# Patient Record
Sex: Female | Born: 1950 | Race: White | Hispanic: Yes | Marital: Married | Smoking: Former smoker
Health system: Southern US, Community
[De-identification: ages and names within clinical notes are randomized; demographics above are authoritative.]

## PROBLEM LIST (undated history)

## (undated) DIAGNOSIS — I70269 Atherosclerosis of native arteries of extremities with gangrene, unspecified extremity: Secondary | ICD-10-CM

## (undated) HISTORY — DX: Atherosclerosis of native arteries of extremities with gangrene, unspecified extremity: I70.269

---

## 2010-09-23 ENCOUNTER — Encounter (INDEPENDENT_AMBULATORY_CARE_PROVIDER_SITE_OTHER): Payer: Self-pay | Admitting: Vascular Surgery

## 2010-09-23 ENCOUNTER — Encounter (INDEPENDENT_AMBULATORY_CARE_PROVIDER_SITE_OTHER): Payer: Self-pay

## 2010-09-23 DIAGNOSIS — I739 Peripheral vascular disease, unspecified: Secondary | ICD-10-CM

## 2010-09-23 DIAGNOSIS — L98499 Non-pressure chronic ulcer of skin of other sites with unspecified severity: Secondary | ICD-10-CM

## 2010-09-23 NOTE — Assessment & Plan Note (Signed)
OFFICE VISIT  Taylor Valentine, Taylor Valentine DOB:  1950/06/03                                       09/23/2010 EAVWU#:[id number]  CHIEF COMPLAINT:  Gangrene, right third toe.  HISTORY OF PRESENT ILLNESS:  The patient is a 60 year old female referred by Dr. Suzette Battiest for evaluation of right third toe gangrene. This has been present for approximately 3 weeks.  The patient apparently ran over her toe with some scaffolding approximately with months ago, and it has slowly deteriorated.  The patient really has no significant past medical history.  However, she does not have a primary care doctor and usually only goes to the doctor if she has some sort of problem. She does not know if she has a history of diabetes.  She is a smoker and currently smokes 1/2 pack of cigarettes per day.  She was placed on Keflex Monday with no real change in the appearance of her foot.  She denies any fever or chills.  She does not know if she has hypertension or elevated cholesterol.  She was hypertensive on our office visit today.  She denies any family history of early vascular disease at age less than 61 and states that no other people in her family have had this sort of problem.  PAST MEDICAL HISTORY:  Otherwise unremarkable.  PAST SURGICAL HISTORY:  None.  SOCIAL HISTORY:  She is married.  She has 2 children.  She works in her own business as a Warehouse manager.  Smoking history is as listed above.  She does not consume alcohol regularly.  FAMILY HISTORY:  As listed above.  REVIEW OF SYSTEMS:  A full 12-point review of systems was performed with the patient today.  All of the systems were negative.  Please see intake referral form for details.  ALLERGIES:  She has no known drug allergies.  MEDICATIONS:  Keflex 500 mg 3 times a day.  PHYSICAL EXAMINATION:  Vital Signs:  Blood pressure 176/93 in the left arm, heart rate 80 and regular, respirations 18.  HEENT:   Unremarkable. Neck:  2+ carotid pulses without bruit.  Chest:  Clear to auscultation. Cardiac:  Exam is regular rate and rhythm without murmur.  Abdomen: Soft, nontender, nondistended, no masses.  Extremities:  She has 2+ radial pulses bilaterally.  She has a 2+ left femoral pulse.  She has an absent right femoral pulse.  She has absent popliteal and pedal pulses bilaterally.  Musculoskeletal:  Exam shows no obvious major joint deformities.  Neurologic:  Exam shows symmetric upper extremity and lower extremity motor strength which is 5/5.  Skin:  She has an open toe wound with early gangrenous changes of the right third toe.  There is exposed bone at the distal phalanx.  There is no real significant surrounding erythema.  There is no purulent drainage.  There is no obvious abscess.  STUDIES:  She had bilateral ABIs performed today which were 0.56 on the right, 0.77 on the left.  Waveforms were monophasic bilaterally.  SUMMARY:  The patient has evidence of gangrene in her right third toe with evidence of arterial compromise with decreased ABIs.  The best option at this point would be to schedule her for an arteriogram, bilateral lower extremity runoff, possible angioplasty and stenting or potentially she may get a bypass reconstruction to improve perfusion of her right foot.  I  discussed with her today that she will definitely need amputation of her third toe, but we would wait to see what the arteriogram showed to determine what her revascularization options would be and then schedule her for the toe amputation subsequent to this. Risks, benefits, possible complications and procedure details including but not limited to bleeding, infection, vessel injury, contrast reaction were explained to the patient today.  She understands and agrees to proceed.  Arteriogram is scheduled for tomorrow, September 24, 2010.    Janetta Hora. Fields, MD Electronically Signed  CEF/MEDQ  D:  09/23/2010  T:   09/23/2010  Job:  [id number]  cc:   Sherlynn Stalls Dr. Cy Blamer

## 2010-09-24 ENCOUNTER — Other Ambulatory Visit: Payer: Self-pay | Admitting: Vascular Surgery

## 2010-09-24 ENCOUNTER — Encounter (HOSPITAL_COMMUNITY)
Admit: 2010-09-24 | Discharge: 2010-09-24 | Disposition: A | Discharge status: Home / Self Care | Payer: Self-pay | Source: Ambulatory Visit | Attending: Vascular Surgery | Admitting: Vascular Surgery

## 2010-09-24 ENCOUNTER — Encounter: Payer: Self-pay | Admitting: Family Medicine

## 2010-09-24 ENCOUNTER — Ambulatory Visit (HOSPITAL_COMMUNITY)
Admission: RE | Admit: 2010-09-24 | Discharge: 2010-09-24 | Disposition: A | Discharge status: Home / Self Care | Payer: Self-pay | Source: Ambulatory Visit | Attending: Vascular Surgery | Admitting: Vascular Surgery

## 2010-09-24 ENCOUNTER — Ambulatory Visit (HOSPITAL_COMMUNITY): Payer: Self-pay

## 2010-09-24 DIAGNOSIS — Z01818 Encounter for other preprocedural examination: Secondary | ICD-10-CM | POA: Insufficient documentation

## 2010-09-24 DIAGNOSIS — I70269 Atherosclerosis of native arteries of extremities with gangrene, unspecified extremity: Secondary | ICD-10-CM

## 2010-09-24 DIAGNOSIS — Z01810 Encounter for preprocedural cardiovascular examination: Secondary | ICD-10-CM | POA: Insufficient documentation

## 2010-09-24 DIAGNOSIS — Z01812 Encounter for preprocedural laboratory examination: Secondary | ICD-10-CM | POA: Insufficient documentation

## 2010-09-24 DIAGNOSIS — I708 Atherosclerosis of other arteries: Secondary | ICD-10-CM | POA: Insufficient documentation

## 2010-09-24 LAB — CBC
MCH: 30.4 pg (ref 26.0–34.0)
Platelets: 328 10*3/uL (ref 150–400)
RBC: 4.51 MIL/uL (ref 3.87–5.11)
WBC: 8.1 10*3/uL (ref 4.0–10.5)

## 2010-09-24 LAB — COMPREHENSIVE METABOLIC PANEL
ALT: 15 U/L (ref 0–35)
Albumin: 3.2 g/dL — ABNORMAL LOW (ref 3.5–5.2)
Calcium: 8.8 mg/dL (ref 8.4–10.5)
GFR calc Af Amer: >60 mL/min (ref >60)
Glucose, Bld: 242 mg/dL — ABNORMAL HIGH (ref 70–99)
Sodium: 138 mEq/L (ref 135–145)
Total Protein: 6.4 g/dL (ref 6.0–8.3)

## 2010-09-24 LAB — POCT I-STAT, CHEM 8
BUN: 17 mg/dL (ref 6–23)
Calcium, Ion: 1.14 mmol/L (ref 1.12–1.32)
Chloride: 104 mEq/L (ref 96–112)
HCT: 45 % (ref 36.0–46.0)
Potassium: 4.1 mEq/L (ref 3.5–5.1)
Sodium: 140 mEq/L (ref 135–145)

## 2010-09-24 LAB — PROTIME-INR
INR: 1.04 (ref 0.00–1.49)
Prothrombin Time: 13.8 seconds (ref 11.6–15.2)

## 2010-09-24 LAB — GLUCOSE, CAPILLARY
Glucose-Capillary: 201 mg/dL — ABNORMAL HIGH (ref 70–99)
Glucose-Capillary: 148 mg/dL — ABNORMAL HIGH (ref 70–99)

## 2010-09-24 NOTE — Progress Notes (Signed)
  Subjective:    Patient ID: Taylor Valentine, female    DOB: [birth year], 60 y.o.   MRN: [id number]  HPI    Review of Systems     Objective:   Physical Exam        Assessment & Plan:  Requested by Dr. Fabienne Bruns, vascular surgeon, to see this patient. I am currently one of the interns on the FMTS, and he paged me.  Found her sugars to be in the 180s during her arteriogram today, and he would like [place] to manage her newly diagnosed diabetes.  She has severe tibial disease and will be getting a bypass in the next 1-2 weeks. Dr. Darrick Penna is also requesting we follow the patient while she is in the hospital for this patient.  I have requested the front desk to schedule a new patient appointment for this patient in the next week. I discussed this with Dr. McDiarmid who feels it is appropriate for [place] to fulfill this request.

## 2010-09-27 LAB — URINALYSIS, ROUTINE W REFLEX MICROSCOPIC
Nitrite: NEGATIVE
Specific Gravity, Urine: >1.046 — ABNORMAL HIGH (ref 1.005–1.030)
Urobilinogen, UA: 1 mg/dL (ref 0.0–1.0)

## 2010-09-27 LAB — SURGICAL PCR SCREEN
MRSA, PCR: NEGATIVE
Staphylococcus aureus: NEGATIVE

## 2010-09-27 LAB — ABO/RH: ABO/RH(D): B POS

## 2010-09-29 ENCOUNTER — Ambulatory Visit (INDEPENDENT_AMBULATORY_CARE_PROVIDER_SITE_OTHER): Payer: Self-pay | Admitting: Vascular Surgery

## 2010-09-29 DIAGNOSIS — I70269 Atherosclerosis of native arteries of extremities with gangrene, unspecified extremity: Secondary | ICD-10-CM

## 2010-09-30 ENCOUNTER — Ambulatory Visit (INDEPENDENT_AMBULATORY_CARE_PROVIDER_SITE_OTHER): Payer: Self-pay | Admitting: Family Medicine

## 2010-09-30 ENCOUNTER — Encounter: Payer: Self-pay | Admitting: Family Medicine

## 2010-09-30 VITALS — BP 135/80 | HR 84 | Temp 98.2°F | Wt 172.0 lb

## 2010-09-30 DIAGNOSIS — I96 Gangrene, not elsewhere classified: Secondary | ICD-10-CM

## 2010-09-30 DIAGNOSIS — E118 Type 2 diabetes mellitus with unspecified complications: Secondary | ICD-10-CM | POA: Insufficient documentation

## 2010-09-30 DIAGNOSIS — Z720 Tobacco use: Secondary | ICD-10-CM | POA: Insufficient documentation

## 2010-09-30 DIAGNOSIS — E669 Obesity, unspecified: Secondary | ICD-10-CM

## 2010-09-30 DIAGNOSIS — F17200 Nicotine dependence, unspecified, uncomplicated: Secondary | ICD-10-CM

## 2010-09-30 MED ORDER — METFORMIN HCL 500 MG PO TABS: 500.0000 mg | ORAL_TABLET | Freq: Two times a day (BID) | ORAL | Status: DC

## 2010-09-30 NOTE — Assessment & Plan Note (Signed)
Per Ortho.  Will try to control blood glucose.

## 2010-09-30 NOTE — Assessment & Plan Note (Signed)
Explained that both smoking and DM contribute to vascular disease, explained risks of MI and CVA.

## 2010-09-30 NOTE — Assessment & Plan Note (Signed)
New diagnosis.  Will start metformin today.  Due to pt's financial issues, will not draw labs until she has the [facility] card.  Plan on drawing urine micro albumin, Hg A1C, referring for retinal exam, and referring to Rx clinic for diabetes management when pt has better access to care and is recovered from surgery.  Gave diabetic diet pt info.  Emphasized that blood sugar control important for wound healing.

## 2010-09-30 NOTE — Progress Notes (Signed)
  Subjective:    Patient ID: Taylor Valentine, female    DOB: [birth year], 60 y.o.   MRN: [id number]  HPI  Taylor Valentine presents to establish care because of a referral from the surgeon.  Patient has not seen a doctor in about 20 years.  She is originally from [place], but has been in the [place] for about 30 years, but has no insurance.   A few months ago she stubbed her toes (right great and third right toe), and the wound never healed.  On June 7th, she went to the foot center for evaluation.  The foot center felt her toe was gangrenous and in need of debriedment.  She had a same day surgery the next day with debridement.  Labs were drawn upon admission, and pt's labs showed a fasting blood sugar of 183.  The surgeon felt pt needed to be evaluated immediately for hyperglycemia, and she was referred to our practice.  Pt denies any prior medical problems or surgeries.   She is scheduled for toe amputation of there third right toe on 09/03/10.  Pt stated surgeon believed right great toe would heal.   Review of Systems Negative except stated in HPI.     Objective:   Physical Exam BP 135/80  Pulse 84  Temp(Src) 98.2 F (36.8 C) (Oral)  Wt 172 lb (78.019 kg) General appearance: alert, cooperative, no distress and moderately obese Eyes: conjunctivae/corneas clear. PERRL, EOM's intact. Fundi benign. Throat: lips, mucosa, and tongue normal; teeth and gums normal Neck: no adenopathy, no JVD, supple, symmetrical, trachea midline and thyroid not enlarged, symmetric, no tenderness/mass/nodules Lungs: clear to auscultation bilaterally Heart: regular rate and rhythm, S1, S2 normal, no murmur, click, rub or gallop Extremities: Right great and middle toes with open gangrene sores.  Otherwise extremities WNL.  Pulses: 2+ and symmetric in radius, pedal pulses 1+ and symmetric.  Skin: Normal except stated above about pt's toes.        Assessment & Plan:

## 2010-09-30 NOTE — Assessment & Plan Note (Addendum)
OFFICE VISIT  SAHER, DAVEE DOB:  07-Mar-1951                                       09/29/2010 [id number]#:[id number]  I saw this patient back in the office today to check on her right third toe.  This is a patient of Dr. Darrick Penna seen in consultation on 09/23/2010 with gangrenous changes of the right third toe.  This was secondary to an injury.  She underwent arteriogram and has a distal right external iliac artery stenosis in addition to occluded superficial femoral artery on the right with reconstitution of the above knee popliteal artery and severe tibial artery occlusive disease.  She is scheduled for right femoral endarterectomy and right fem-pop bypass graft with a third toe amputation with Dr. Darrick Penna on Monday.  She comes in today to have her toe check.  She had no fever or chills.  She has no significant pain associated with the wound.  PHYSICAL EXAMINATION:  Blood pressure is 125/79, heart rate is 78.  The toe is stable with no significant drainage or erythema.  She has gangrene of the third toe, limited to the third toe, with exposed bone.  She had finished her Keflex so I have written her enough Keflex to get her until the surgery on Monday.  I have given her 15 to take 500 t.i.d. She will continue to soak the foot and keep a dry dressing on this.    Di Kindle. Edilia Bo, M.D. Electronically Signed  CSD/MEDQ  D:  09/29/2010  T:  09/30/2010  Job:  [id number]

## 2010-09-30 NOTE — Assessment & Plan Note (Signed)
Likely contributing to insulin resistance.  Pt advised of this, encouraged lifestyle changes.

## 2010-09-30 NOTE — Patient Instructions (Signed)
It was nice to meet you.  I am sorry you are going through a lot with your toes.  I am starting you on a medication for Diabetes (high blood sugar).  It is called Metformin.  For the first three days, I want you to take one pill at dinner time.  Then I want you to take one pill in the morning with breakfast and one pill at night time with dinner.    Please make an appointment to see me again on 2-4 weeks to check on your blood sugar.

## 2010-10-04 ENCOUNTER — Inpatient Hospital Stay (HOSPITAL_COMMUNITY)
Admission: RE | Admit: 2010-10-04 | Discharge: 2010-10-07 | DRG: 253 | Disposition: A | Discharge status: Home Health | Payer: Self-pay | Source: Ambulatory Visit | Attending: Vascular Surgery | Admitting: Vascular Surgery

## 2010-10-04 ENCOUNTER — Ambulatory Visit (HOSPITAL_COMMUNITY): Payer: Self-pay

## 2010-10-04 DIAGNOSIS — F17200 Nicotine dependence, unspecified, uncomplicated: Secondary | ICD-10-CM | POA: Diagnosis present

## 2010-10-04 DIAGNOSIS — I70269 Atherosclerosis of native arteries of extremities with gangrene, unspecified extremity: Secondary | ICD-10-CM

## 2010-10-04 DIAGNOSIS — E1159 Type 2 diabetes mellitus with other circulatory complications: Principal | ICD-10-CM | POA: Diagnosis present

## 2010-10-04 DIAGNOSIS — I96 Gangrene, not elsewhere classified: Secondary | ICD-10-CM | POA: Diagnosis present

## 2010-10-04 HISTORY — PX: TOE AMPUTATION: SHX809

## 2010-10-04 HISTORY — PX: FEMORAL BYPASS: SHX50

## 2010-10-04 LAB — GLUCOSE, CAPILLARY
Glucose-Capillary: 129 mg/dL — ABNORMAL HIGH (ref 70–99)
Glucose-Capillary: 169 mg/dL — ABNORMAL HIGH (ref 70–99)

## 2010-10-05 ENCOUNTER — Other Ambulatory Visit: Payer: Self-pay | Admitting: Vascular Surgery

## 2010-10-05 DIAGNOSIS — I739 Peripheral vascular disease, unspecified: Secondary | ICD-10-CM

## 2010-10-05 DIAGNOSIS — Z48812 Encounter for surgical aftercare following surgery on the circulatory system: Secondary | ICD-10-CM

## 2010-10-05 LAB — GLUCOSE, CAPILLARY
Glucose-Capillary: 170 mg/dL — ABNORMAL HIGH (ref 70–99)
Glucose-Capillary: 232 mg/dL — ABNORMAL HIGH (ref 70–99)
Glucose-Capillary: 244 mg/dL — ABNORMAL HIGH (ref 70–99)

## 2010-10-05 LAB — BASIC METABOLIC PANEL
Calcium: 8.8 mg/dL (ref 8.4–10.5)
GFR calc non Af Amer: >60 mL/min (ref >60)
Glucose, Bld: 254 mg/dL — ABNORMAL HIGH (ref 70–99)
Sodium: 136 mEq/L (ref 135–145)

## 2010-10-05 LAB — CBC
MCH: 29.3 pg (ref 26.0–34.0)
MCHC: 33.1 g/dL (ref 30.0–36.0)
Platelets: 304 10*3/uL (ref 150–400)

## 2010-10-06 LAB — URINE MICROSCOPIC-ADD ON

## 2010-10-06 LAB — URINALYSIS, ROUTINE W REFLEX MICROSCOPIC
Bilirubin Urine: NEGATIVE
Ketones, ur: NEGATIVE mg/dL
Specific Gravity, Urine: 1.015 (ref 1.005–1.030)
Urobilinogen, UA: 0.2 mg/dL (ref 0.0–1.0)
pH: 6 (ref 5.0–8.0)

## 2010-10-06 LAB — GLUCOSE, CAPILLARY: Glucose-Capillary: 202 mg/dL — ABNORMAL HIGH (ref 70–99)

## 2010-10-07 LAB — URINE CULTURE
Colony Count: 9000
Culture  Setup Time: 201206201227

## 2010-10-10 NOTE — Discharge Summary (Signed)
  NAMEGEORGINA, Taylor Valentine NO.:  [id number]  MEDICAL RECORD NO.:  [id number]  LOCATION:  2010                         FACILITY:  [facility]  PHYSICIAN:  Janetta Hora. Lakendrick Paradis, MD  DATE OF BIRTH:  30-Jan-1951  DATE OF ADMISSION:  10/04/2010 DATE OF DISCHARGE:  10/06/2010                              DISCHARGE SUMMARY   ADMISSION DIAGNOSIS:  Gangrene, right third toe.  HISTORY OF PRESENT ILLNESS:  This is a 60 year old female that was referred by Dr. Leticia Penna for evaluation of right third toe gangrene.  It has been going on for approximately 3 weeks.  The patient apparently ran over her toe with some scaffolding approximately 2 months ago and it has slowly deteriorated.  The patient really had no significant past medical history.  However, she does not have a primary care doctor and usually only goes to the doctor if she has some sort of problems.  She does not know if she has a history of diabetes.  She is a smoker and currently smokes one-half pack of cigarettes per day.  She was placed on Keflex with no real change in the appearance of her foot.  She denies any fever or chills.  She does not know if she has hypertension or elevated cholesterol, but was hypertensive in the office.  HOSPITAL COURSE:  The patient was admitted to the hospital and taken to the operating room on October 04, 2010, where she underwent a right femoral to above-knee popliteal bypass using reversed right greater saphenous vein as well as amputation of right third toe.  She tolerated the procedure well and was transported to the recovery room in satisfactory condition.  By postoperative day #1, she did have bi-triphasic DP and PT pulses on the right.  She did have some low-grade temperature postoperatively, but at discharge is afebrile.  Otherwise, her postoperative course included increasing ambulation as well as increasing intake of solids without difficulty.  DISCHARGE INSTRUCTIONS:  She is  discharged home with extensive instructions on wound care and progressive ambulation.  She is instructed not to drive or perform any heavy lifting for 1 month.  DISCHARGE DIAGNOSIS: 1. Gangrene, right third toe.     a.     Status post right femoral to above-knee popliteal bypass      using reversed saphenous vein on the right.     b.     Amputation of right third toe on October 04, 2010. 2. Diabetes with a hemoglobin A1c of 8.4. 3. Tobacco use.  DISCHARGE MEDICATIONS: 1. Oxycodone 5 mg 1 p.o. q.4-6 h. p.r.n. pain, #30, no refill. 2. Advil 200 mg 1-2 p.o. daily p.r.n.  Preoperative ABIs on the right was 0.56 and 0.77 on the left, postoperative 0.46 on the right and 0.68 on the left.     Taylor Pigg, PA   ______________________________ Janetta Hora Skylarr Liz, MD    SE/MEDQ  D:  10/07/2010  T:  10/08/2010  Job:  [id number]  cc:   Sherlynn Stalls, DPM Dorna Leitz Tomaszewski  Electronically Signed by Taylor Pigg PA on 10/08/2010 09:50:20 AM Electronically Signed by Fabienne Bruns MD on 10/10/2010 12:34:24 PM

## 2010-10-10 NOTE — Op Note (Signed)
Taylor Valentine, Taylor Valentine NO.:  [id number]  MEDICAL RECORD NO.:  [id number]  LOCATION:  2010                         FACILITY:  [facility]  PHYSICIAN:  Janetta Hora. Fields, MD  DATE OF BIRTH:  12/04/1950  DATE OF PROCEDURE:  10/04/2010 DATE OF DISCHARGE:                              OPERATIVE REPORT   PROCEDURES: 1. Right femoral to above-knee popliteal bypass using reversed right     greater saphenous vein. 2. Amputation of right third toe.  PREOPERATIVE DIAGNOSIS:  Gangrene, right third toe.  POSTOPERATIVE DIAGNOSIS:  Gangrene, right third toe.  ANESTHESIA:  General.  SURGEON:  Charles E. Fields, MD  ASSISTANT:  Newton Pigg, PA-C  OPERATIVE FINDINGS: 1. 3.5-4 mm right greater saphenous vein. 2. Good bleeding tissues and toe amputation site.  OPERATIVE DETAILS:  After obtaining informed consent, the patient was taken to the operating room.  The patient was placed in supine position on the operating table.  After induction of general anesthesia and endotracheal intubation, a Foley catheter was placed.  Next, the patient's entire right lower extremity was prepped and draped in the usual sterile fashion.  A curvilinear incision was made in the right groin and carried down through the subcutaneous tissues down to the level of the right common femoral artery.  Preoperative arteriogram had showed a high-grade stenosis essentially at the level of the inguinal ligament.  Therefore, the common femoral artery was dissected free circumferentially and approximately a centimeter-and-half of the inguinal ligament was taken down to expose the more distal external iliac artery.  Several side branches were ligated and divided between silk ties.  The artery was dissected free circumferentially.  There was palpable plaque along the posterior wall at the area that the arteriogram had showed the high-grade stenosis.  The artery was dissected free several centimeters above  this, so that vascular control could be obtained in the distal right external iliac artery.  Next, the profunda femoris and superficial femoral arteries were dissected free circumferentially and vessel loops were placed around these.  Greater saphenous vein was then harvested through several skip incisions on the medial aspect of the right leg.  The vein was of good quality, approximately 3.5-4 mm in diameter.  Several small side branches were ligated and divided between silk ties or clips.  The vein was harvested down to the level of the knee.  The medial aspect incision near the knee was then deepened and the sartorius muscle reflected posteriorly and the above-knee popliteal space entered.  The above-knee popliteal artery was soft in character.  This was dissected free circumferentially.  A few geniculate branches were ligated and divided between silk ties to allow further exposure.  Vessel loops were placed proximal and distal to the planned site of arteriotomy.  At this point, a deep tunnel was created in the subsartorial space connecting the above-knee incision to the groin incision and the patient was then given 7000 units of intravenous heparin.  The distal external iliac artery was controlled with a Cooley clamp.  The superficial femoral and profunda femoris arteries were controlled with vessel loops.  Longitudinal opening was made in the very proximal common femoral and distal external iliac  artery and extended with Potts scissors.  There was an ulcerated segment along the posterior aspect of the artery and the artery was quite thickened in this area representing the area that was a high-grade stenosis on previous arteriogram.  In order to widen the segment, it was decided to make the fem-pop anastomosis basically over the area that was narrowing, so this would act as a patch angioplasty.  The vein was placed in reverse configuration and spatulated.  Vein was then sewn  end-of-vein to side-of- artery using a running 5-0 Prolene suture.  Just prior to completion anastomosis, this was fore-bled and back-bled and thoroughly flushed. Anastomosis was secured, clamps were released, and there was a good pulsatile flow in the graft immediately.  Graft was clamped distally and it was marked for orientation.  It was then brought through the deep tunnel down to the above-knee popliteal space.  The popliteal artery was then controlled proximally and distally with fine bulldog clamps.  A longitudinal opening was made in the popliteal artery.  The artery was of good quality.  The vein graft was cut to length and spatulated and sewn end-of-vein to side of artery using a running 6-0 Prolene suture. Just prior to completion anastomosis, this was fore-bled and back-bled and thoroughly flushed.  Anastomosis was secured, clamps were released, and there was pulsatile flow in the distal popliteal artery immediately. There was also good Doppler flow in the posterior tibial and dorsalis pedis artery areas.  At this point, hemostasis was obtained in all incisions.  The subcutaneous tissues of all incisions were then closed in multiple layers of running 2-0 and 3-0 Vicryl suture.  Groin was closed with 4-0 Vicryl subcuticular stitch.  The other incisions were closed with staples.  Dry sterile dressings were applied.  Attention was then turned the patient's right foot.  Distal tip of the toe was grasped with towel clamp.  A circumferential incision was made at the base of the right third toe.  Toe was then transected with a bone cutter.  The tendons and nerves were pulled down the operative field, transected laterally, and retracted up into the toe.  There was good bleeding from the toes tissue.  Bone was debrided back to the proximal phalanx to allow a tension-free closure.  Wound was thoroughly irrigated with normal saline solution.  Toe was passed off the table as  specimen. Incision was then closed with 2 interrupted 3-0 nylon sutures.  The patient tolerated the procedure well and there were no immediate complications.  Instrument, sponge, and needle counts were correct at the end of the case.  The patient was taken to the recovery room in stable condition.     Janetta Hora. Fields, MD     CEF/MEDQ  D:  10/05/2010  T:  10/06/2010  Job:  [id number]  Electronically Signed by Fabienne Bruns MD on 10/10/2010 12:34:21 PM

## 2010-10-10 NOTE — Op Note (Signed)
Taylor Valentine, Taylor Valentine NO.:  [id number]  MEDICAL RECORD NO.:  [id number]  LOCATION:  [facility]                         FACILITY:  [facility]  PHYSICIAN:  Janetta Hora. Milagro Belmares, MD  DATE OF BIRTH:  1950-12-06  DATE OF PROCEDURE:  09/24/2010 DATE OF DISCHARGE:  09/24/2010                              OPERATIVE REPORT   PROCEDURE:  Aortogram with bilateral lower extremity runoff.  PREOPERATIVE DIAGNOSIS:  Gangrene, right foot.  POSTOPERATIVE DIAGNOSIS:  Gangrene, right foot.  ANESTHESIA:  Local with IV sedation.  OPERATIVE DETAILS:  After obtaining informed consent, the patient was taken to the [facility] lab.  The patient was placed in the supine position on the angio table.  Both groins were prepped and draped in usual sterile fashion.  Local anesthesia was infiltrated over the left common femoral artery.  An introducer needle was used to cannulate the left common femoral artery and a 0.035 Versacore wire threaded into the abdominal aorta under fluoroscopic guidance.  Next, a 5-French sheath was placed over the guidewire in to the left common femoral artery and this was thoroughly flushed with heparinized saline.  A 5-French pigtail catheter was then placed over the Versacore wire up into the abdominal aorta and abdominal aortogram was obtained.  This shows bilateral single renal arteries which were patent.  The infrarenal abdominal aorta is patent. The left and right common iliac arteries were patent.  The left and right internal iliac arteries are patent.  Next, the pigtail catheter was pulled down to the aortic bifurcation and a pelvic angiogram was obtained.  This again shows that the left and right common and internal iliac arteries are patent.  The left external iliac artery is patent. There is a high-grade stenosis approximately 80% at the junction of the right external iliac and common femoral artery just above the level of the femoral head.  Next, the pigtail catheter  was left in place and bilateral lower extremity runoff views were obtained.  In the right lower extremity, there is again noted the high-grade stenosis of 80% between the external iliac and right common femoral junction.  The right common femoral artery is patent.  The right profunda femoris artery is patent.  The right superficial femoral artery is occluded at its origin.  The right superficial femoral artery reconstitutes via geniculate collaterals and the entire above knee and below knee popliteal artery is patent.  The below-knee popliteal artery is patent.  Below this, however, there is severe tibial disease.  The anterior tibial artery is occluded.  The posterior tibial artery is occluded and the peroneal artery is occluded.  The foot fills via collateral vessels only with no named vessel in the foot itself.  In the left lower extremity, the left common femoral artery is patent. The left profunda femoris artery is patent.  The left superficial femoral artery is occluded at its origin.  The left above-knee popliteal artery reconstitutes via profunda collaterals.  The above-knee and below- knee popliteal artery is patent.  The anterior tibial artery is patent and a single-vessel runoff all the way to the left foot.  The tibioperoneal trunk, posterior tibial, and peroneal arteries are occluded.  At this  point, the pigtail catheter was pulled back over a guidewire.  A 5-French sheath was thoroughly flushed with heparinized saline to be pulled in the holding area.  The patient tolerated the procedure well and there were no complications.  The patient was taken to the holding area in stable condition.  OPERATIVE FINDINGS: 1. 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     Also, right superficial femoral artery occlusion with     reconstitution of the above-knee popliteal artery via profunda      collaterals.  The below-knee popliteal artery is patent.  However,     all 3 tibial vessels are occluded in the right distal leg. There is     no named vessel in the right foot. 2. In the left lower extremity, left superficial femoral artery     occlusion with reconstitution of the above-knee popliteal artery     and single-vessel runoff to the left foot via the anterior tibial     artery.  The patient will be scheduled for a right femoral endarterectomy and right femoral to above-knee popliteal bypass and the amputation of her right third toe in the near future.  It was discussed with the patient that she has very severe tibial artery occlusive disease and that she is at very high risk for a below-knee amputation, but hopefully revascularizing her leg to the knee level will be enough flow to heal up the amputation in her right side.     Janetta Hora. Toryn Dewalt, MD     CEF/MEDQ  D:  09/24/2010  T:  09/25/2010  Job:  [id number]  cc:   Glennon Hamilton, DPM  Electronically Signed by Fabienne Bruns MD on 10/10/2010 12:34:11 PM

## 2010-10-18 ENCOUNTER — Encounter: Payer: Self-pay | Admitting: Vascular Surgery

## 2010-10-27 ENCOUNTER — Ambulatory Visit (INDEPENDENT_AMBULATORY_CARE_PROVIDER_SITE_OTHER): Payer: Self-pay | Admitting: Family Medicine

## 2010-10-27 ENCOUNTER — Encounter: Payer: Self-pay | Admitting: Family Medicine

## 2010-10-27 VITALS — BP 112/83 | HR 104 | Temp 98.2°F | Wt 163.2 lb

## 2010-10-27 DIAGNOSIS — E669 Obesity, unspecified: Secondary | ICD-10-CM

## 2010-10-27 DIAGNOSIS — Z720 Tobacco use: Secondary | ICD-10-CM

## 2010-10-27 DIAGNOSIS — E118 Type 2 diabetes mellitus with unspecified complications: Secondary | ICD-10-CM

## 2010-10-27 DIAGNOSIS — I96 Gangrene, not elsewhere classified: Secondary | ICD-10-CM

## 2010-10-27 DIAGNOSIS — F17200 Nicotine dependence, unspecified, uncomplicated: Secondary | ICD-10-CM

## 2010-10-27 LAB — POCT GLYCOSYLATED HEMOGLOBIN (HGB A1C): Hemoglobin A1C: 7.6

## 2010-10-27 NOTE — Patient Instructions (Signed)
It was good to see you, congratulations on quitting smoking and your weight loss, you have worked really hard to take care of yourself, keep it up.    It was good to see you today.  Your Hemoglobin A1C is  Lab Results  Component Value Date   HGBA1C 7.6 10/27/2010  .  Remember your goal for A1C is less than 7.  Your goal for fasting morning blood sugar is 80-120.    I am not going to change any of your medications today, I think you will be able to get your blood sugars controlled with the medication you are on, and continuing your lifestyle changes.  I would like to see you in about three months- please ask the front desk to schedule you for you Well Woman exam.

## 2010-10-28 ENCOUNTER — Encounter: Payer: Self-pay | Admitting: Family Medicine

## 2010-10-28 ENCOUNTER — Encounter: Payer: Self-pay | Admitting: Vascular Surgery

## 2010-10-28 ENCOUNTER — Ambulatory Visit (INDEPENDENT_AMBULATORY_CARE_PROVIDER_SITE_OTHER): Payer: Self-pay | Admitting: Vascular Surgery

## 2010-10-28 VITALS — BP 119/66 | HR 82 | Temp 97.8°F

## 2010-10-28 DIAGNOSIS — I739 Peripheral vascular disease, unspecified: Secondary | ICD-10-CM

## 2010-10-28 NOTE — Assessment & Plan Note (Signed)
A1c shows improved control.  Will continue metformin.  Patient has made significant lifestyle changes and is hopeful for getting off of medications eventually.

## 2010-10-28 NOTE — Assessment & Plan Note (Signed)
Toe removed, continue monitoring for ulcers.

## 2010-10-28 NOTE — Assessment & Plan Note (Signed)
Patient has quit smoking, but still having cravings at times, Have congratulated her and will continue to reassess.

## 2010-10-28 NOTE — Assessment & Plan Note (Signed)
Patient very motivated and has lost almost 10 lbs since last visit. Congratulated her. She declines nutrition at this time.

## 2010-10-28 NOTE — Progress Notes (Signed)
  Subjective:    Patient ID: Taylor Valentine, female    DOB: 1950-11-05, 60 y.o.   MRN: [id number]  HPI  Patient presents for follow up of new diagnosis of Diabetes.  She had her toe amputated and spent few days in the hospital.  She says she is only having some minimal pain now.  She has not noticed any new open sores on her feet.  She had a lot of diabetes education in the hospital, and she has been checking her blood sugars twice a day.  In the mornings they have been running about 110-140, and in the evening from 150-100.    Obesity- she has made some significant dietary changes, and has lost nearly 10 lbs in about a month.  She feels good about these changes.  She says she has been eating lots of veggies and staying away from foods with lots of sugar and carbs.  Tobacco use- patient says she quit smoking when she went into the hospital for her surgery, and did not start smoking again.  She does say sometimes she misses it, but she wants to be around for a long time, so she wants to take good care of herself.   Review of Systems Negative except stated in HPI.     Objective:   Physical Exam BP 112/83  Pulse 104  Temp(Src) 98.2 F (36.8 C) (Oral)  Wt 163 lb 3.2 oz (74.027 kg) General appearance: alert, cooperative and no distress Eyes: conjunctivae/corneas clear. PERRL, EOM's intact. Fundi benign. Throat: lips, mucosa, and tongue normal; teeth and gums normal Neck: no adenopathy, no carotid bruit, no JVD, supple, symmetrical, trachea midline and thyroid not enlarged, symmetric, no tenderness/mass/nodules Lungs: clear to auscultation bilaterally Heart: regular rate and rhythm, S1, S2 normal, no murmur, click, rub or gallop Abdomen: soft, non-tender; bowel sounds normal; no masses,  no organomegaly Extremities: s/p right third toe amputation, healing wound on r great toe.  Pulses: 2+ and symmetric       Assessment & Plan:

## 2010-10-28 NOTE — Progress Notes (Signed)
Patient is a 60 year old female who is status post right femoral to above-knee popliteal bypass on 10/04/2010. She also had a amputation of her right third toe at that time. 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 She states that her foot is feeling better and she wishes to return to work.  Physical exam:  Right third toe indications it has some dry eschar at the tip but overall appears well-healed  Right groin and thigh incisions are well-healed  Foot is pink warm and well perfused.  Assessment: Status post right femoral to above-knee popliteal bypass with healing incisions and toe amputation site  Plan: Followup duplex ultrasound of her bypass graft in 2 months. It is okay for the patient to gradually return to work at this point.

## 2010-10-28 NOTE — Progress Notes (Signed)
Post op Right Femoral-Popliteal BPG on 10-04-10,   Pt doing well, afebrile, no drainage from incisions, not taking any pain meds now.

## 2010-12-29 ENCOUNTER — Encounter (INDEPENDENT_AMBULATORY_CARE_PROVIDER_SITE_OTHER): Payer: Self-pay | Admitting: *Deleted

## 2010-12-29 DIAGNOSIS — Z48812 Encounter for surgical aftercare following surgery on the circulatory system: Secondary | ICD-10-CM

## 2010-12-29 DIAGNOSIS — I739 Peripheral vascular disease, unspecified: Secondary | ICD-10-CM

## 2011-01-04 ENCOUNTER — Other Ambulatory Visit: Payer: Self-pay | Admitting: Family Medicine

## 2011-01-04 NOTE — Telephone Encounter (Signed)
Refill request

## 2011-01-07 ENCOUNTER — Encounter: Payer: Self-pay | Admitting: Vascular Surgery

## 2011-01-07 NOTE — Procedures (Unsigned)
BYPASS GRAFT EVALUATION  INDICATION:  Follow up right fem-pop graft placed 10/04/10.  HISTORY: Diabetes:  Unknown Cardiac:  Unknown Hypertension:  Unknown Smoking:  Yes Previous Surgery:  Right third toe amputation at time of graft placement  SINGLE LEVEL ARTERIAL EXAM                              RIGHT              LEFT Brachial: Anterior tibial: Posterior tibial: Peroneal: Ankle/brachial index:  PREVIOUS ABI:  Date: 09/23/10  RIGHT:  0.56  LEFT:  0.77  LOWER EXTREMITY BYPASS GRAFT DUPLEX EXAM:  DUPLEX: 1. Widely patent right femoropopliteal graft without evidence of     restenosis or hyperplasia. 2. Please see diagram for details.  IMPRESSION:  Widely patent right femoropopliteal graft.  ___________________________________________ Janetta Hora. Fields, MD  LT/MEDQ  D:  12/29/2010  T:  12/29/2010  Job:  [id number]

## 2011-01-25 ENCOUNTER — Ambulatory Visit (INDEPENDENT_AMBULATORY_CARE_PROVIDER_SITE_OTHER): Payer: Self-pay | Admitting: Family Medicine

## 2011-01-25 ENCOUNTER — Encounter: Payer: Self-pay | Admitting: Family Medicine

## 2011-01-25 VITALS — BP 121/75 | HR 79 | Temp 98.0°F | Ht 66.0 in | Wt 159.1 lb

## 2011-01-25 DIAGNOSIS — L600 Ingrowing nail: Secondary | ICD-10-CM

## 2011-01-25 DIAGNOSIS — Z720 Tobacco use: Secondary | ICD-10-CM

## 2011-01-25 DIAGNOSIS — E118 Type 2 diabetes mellitus with unspecified complications: Secondary | ICD-10-CM

## 2011-01-25 DIAGNOSIS — E669 Obesity, unspecified: Secondary | ICD-10-CM

## 2011-01-25 DIAGNOSIS — F17200 Nicotine dependence, unspecified, uncomplicated: Secondary | ICD-10-CM

## 2011-01-25 NOTE — Assessment & Plan Note (Signed)
Much improvement.  Congratulated pt and advised she is close to a healthy BMI.

## 2011-01-25 NOTE — Assessment & Plan Note (Signed)
Does not appear infected.  Pt instructions printed for doing foot soaks and trying to trim back nail.  Pt agrees to conservative management.

## 2011-01-25 NOTE — Assessment & Plan Note (Signed)
Again, reinforced congratulations on quitting.  Told her I did not want her to be discouraged due to a few cigarettes.  Encouraged her to keep it up.

## 2011-01-25 NOTE — Assessment & Plan Note (Signed)
A1C= 6.1.  At goal.  Continue metformin.

## 2011-01-25 NOTE — Progress Notes (Signed)
  Subjective:    Patient ID: Taylor Valentine, female    DOB: [birth year], 60 y.o.   MRN: [id number]  HPI  Karena Addison comes in for follow up  DM- she is taking her metformin without difficulty, reports fasting blood sugars from 80-120, PM blood sugars from 120's- 160's.  Denies any polyuria, polydipsia, hypoglycemic episodes  Pt complains of pain in the lateral side of her left great toe.  She is worried because of the prior toe amputation due to an infection.  She says it only hurts sometimes, and has not had erythema or drainage of the area.   Obesity- Pt has lost about 10 lbs since being diagnosed with DM and having her toe amputated.  Her BMI is now 26.  She is eating healthier and walking for exercise.  Tobacco- pt quit smoking last summer, but says she has been under a lot of stress since she is currently unemployed.  She says she has smoked about 3 cigarettes in 3 months. No dyspnea, chest pain.   Review of Systems See HPI.     Objective:   Physical Exam  BP 121/75  Pulse 79  Temp(Src) 98 F (36.7 C) (Oral)  Ht 5\' 6"  (1.676 m)  Wt 159 lb 1.6 oz (72.167 kg)  BMI 25.68 kg/m2 General appearance: alert, cooperative and no distress Eyes: conjunctivae/corneas clear. PERRL, EOM's intact. Fundi benign. Throat: lips, mucosa, and tongue normal; teeth and gums normal Lungs: clear to auscultation bilaterally Heart: regular rate and rhythm, S1, S2 normal, no murmur, click, rub or gallop Extremities: extremities normal, atraumatic, no cyanosis or edema and Patient does have ingrown toe nail on medial side of left great toe.  No erythema or drainage.  Pain with palpation. Pulses: 2+ and symmetric Skin: Skin color, texture, turgor normal. No rashes or lesions Neurologic: Grossly normal      Assessment & Plan:

## 2011-01-25 NOTE — Patient Instructions (Addendum)
It was good to see you. You A1C today was 6.1, which is at your goal.  Congratulations on your weight loss - You need to loose another 5-10 lbs to be at a healthy weight.  Please see the attached instructions about your ingrown toe nail.  Please come back and see me in about 6 months to check on your diabetes.

## 2011-04-13 ENCOUNTER — Other Ambulatory Visit: Payer: Self-pay

## 2011-04-29 ENCOUNTER — Other Ambulatory Visit (INDEPENDENT_AMBULATORY_CARE_PROVIDER_SITE_OTHER): Payer: Self-pay | Admitting: *Deleted

## 2011-04-29 ENCOUNTER — Ambulatory Visit (INDEPENDENT_AMBULATORY_CARE_PROVIDER_SITE_OTHER): Payer: Self-pay | Admitting: *Deleted

## 2011-04-29 DIAGNOSIS — I739 Peripheral vascular disease, unspecified: Secondary | ICD-10-CM

## 2011-04-29 DIAGNOSIS — Z48812 Encounter for surgical aftercare following surgery on the circulatory system: Secondary | ICD-10-CM

## 2011-05-16 ENCOUNTER — Other Ambulatory Visit: Payer: Self-pay | Admitting: *Deleted

## 2011-05-16 DIAGNOSIS — Z48812 Encounter for surgical aftercare following surgery on the circulatory system: Secondary | ICD-10-CM

## 2011-05-16 DIAGNOSIS — I739 Peripheral vascular disease, unspecified: Secondary | ICD-10-CM

## 2011-05-18 ENCOUNTER — Encounter: Payer: Self-pay | Admitting: Vascular Surgery

## 2011-05-18 NOTE — Procedures (Unsigned)
BYPASS GRAFT EVALUATION  INDICATION:  Follow up right lower extremity bypass graft.  HISTORY: Diabetes:  No. Cardiac:  No. Hypertension:  No. Smoking:  Current. Previous Surgery:  Right femoral to popliteal bypass graft, 10/04/2010.  SINGLE LEVEL ARTERIAL EXAM                              RIGHT              LEFT Brachial:                    144                140 Anterior tibial:             75                 87 Posterior tibial:            111                61 Peroneal: Ankle/brachial index:        0.77               0.60  PREVIOUS ABI:  Date: 12/29/2010  RIGHT:  0.71  LEFT:  0.72  LOWER EXTREMITY BYPASS GRAFT DUPLEX EXAM:  DUPLEX:  Patent right lower extremity femoral to popliteal bypass graft with monophasic waveforms noted throughout.  IMPRESSION: 1. Bilateral ankle brachial indices are suggestive of moderate     arterial disease. 2. Right ankle brachial index shows slight improvement while the left     ankle brachial index has declined from the previous examination. 3. Right femoral to popliteal bypass graft is patent with velocity     measurements shown on the following worksheet.  ___________________________________________ Janetta Hora. Fields, MD  EM/MEDQ  D:  04/29/2011  T:  04/30/2011  Job:  [id number]

## 2011-07-13 ENCOUNTER — Other Ambulatory Visit: Payer: Self-pay | Admitting: Family Medicine

## 2011-07-13 MED ORDER — METFORMIN HCL 500 MG PO TABS: 500.0000 mg | ORAL_TABLET | Freq: Two times a day (BID) | ORAL | Status: DC

## 2011-08-18 ENCOUNTER — Ambulatory Visit: Payer: Self-pay | Admitting: Vascular Surgery

## 2012-01-17 ENCOUNTER — Other Ambulatory Visit: Payer: Self-pay | Admitting: *Deleted

## 2012-01-17 MED ORDER — METFORMIN HCL 500 MG PO TABS: 500.0000 mg | ORAL_TABLET | Freq: Two times a day (BID) | ORAL | Status: AC

## 2015-05-04 ENCOUNTER — Ambulatory Visit (INDEPENDENT_AMBULATORY_CARE_PROVIDER_SITE_OTHER): Payer: BLUE CROSS/BLUE SHIELD

## 2015-05-04 ENCOUNTER — Emergency Department (HOSPITAL_COMMUNITY)
Admission: EM | Admit: 2015-05-04 | Discharge: 2015-05-04 | Disposition: A | Discharge status: Home / Self Care | Payer: BLUE CROSS/BLUE SHIELD | Attending: Emergency Medicine | Admitting: Emergency Medicine

## 2015-05-04 ENCOUNTER — Ambulatory Visit (INDEPENDENT_AMBULATORY_CARE_PROVIDER_SITE_OTHER): Payer: BLUE CROSS/BLUE SHIELD | Admitting: Family Medicine

## 2015-05-04 ENCOUNTER — Encounter (HOSPITAL_COMMUNITY): Payer: Self-pay | Admitting: Emergency Medicine

## 2015-05-04 VITALS — BP 128/72 | HR 72 | Temp 97.6°F | Resp 20 | Ht 66.0 in | Wt 178.8 lb

## 2015-05-04 DIAGNOSIS — W1839XA Other fall on same level, initial encounter: Secondary | ICD-10-CM | POA: Diagnosis not present

## 2015-05-04 DIAGNOSIS — Z79899 Other long term (current) drug therapy: Secondary | ICD-10-CM | POA: Diagnosis not present

## 2015-05-04 DIAGNOSIS — M25522 Pain in left elbow: Secondary | ICD-10-CM

## 2015-05-04 DIAGNOSIS — Y990 Civilian activity done for income or pay: Secondary | ICD-10-CM | POA: Diagnosis not present

## 2015-05-04 DIAGNOSIS — S59902A Unspecified injury of left elbow, initial encounter: Secondary | ICD-10-CM | POA: Diagnosis present

## 2015-05-04 DIAGNOSIS — Y9289 Other specified places as the place of occurrence of the external cause: Secondary | ICD-10-CM | POA: Insufficient documentation

## 2015-05-04 DIAGNOSIS — Y9389 Activity, other specified: Secondary | ICD-10-CM | POA: Diagnosis not present

## 2015-05-04 DIAGNOSIS — Z8679 Personal history of other diseases of the circulatory system: Secondary | ICD-10-CM | POA: Diagnosis not present

## 2015-05-04 DIAGNOSIS — Z87891 Personal history of nicotine dependence: Secondary | ICD-10-CM | POA: Diagnosis not present

## 2015-05-04 DIAGNOSIS — S52022A Displaced fracture of olecranon process without intraarticular extension of left ulna, initial encounter for closed fracture: Secondary | ICD-10-CM | POA: Insufficient documentation

## 2015-05-04 MED ORDER — OXYCODONE-ACETAMINOPHEN 5-325 MG PO TABS
1.0000 | ORAL_TABLET | Freq: Once | ORAL | Status: AC
Start: 2015-05-04 — End: 2015-05-04
  Administered 2015-05-04: 1 via ORAL
  Filled 2015-05-04: qty 1

## 2015-05-04 MED ORDER — OXYCODONE-ACETAMINOPHEN 5-325 MG PO TABS: 1.0000 | ORAL_TABLET | ORAL | Status: AC | PRN

## 2015-05-04 NOTE — Progress Notes (Signed)
Posterior arm splint and sling placed.

## 2015-05-04 NOTE — ED Notes (Signed)
Pt fall at work resulting in comminuted left elbow fracture of the olecranon; sent pt [facility] to be evaluated by orthopedic.

## 2015-05-04 NOTE — Discharge Instructions (Signed)
An elbow fracture is a break in a bone or bones of your elbow. Your elbow is a hinged joint that is made up of three bones. These bones are the long bone in your upper arm (humerus), the bone in the outer part of your lower arm (radius), and the bone in the inner part of your lower arm (ulna). The tip of your elbow (olecranon) is part of your ulna. °If the fracture is displaced, that means that the bones are not lined up correctly and may be unstable. The bones will be put back into position with a procedure that is called open reduction with internal fixation (ORIF). A combination of metal pins, screws with or without a metal plate, or different types of wiring are used to hold the bones in place. °LET YOUR HEALTH CARE PROVIDER KNOW ABOUT: °· Any allergies you have. °· All medicines you are taking, including vitamins, herbs, eye drops, creams, and over-the-counter medicines. °· Previous problems you or members of your family have had with the use of anesthetics. °· Any blood disorders you have. °· Previous surgeries you have had. °· Any medical conditions you may have. °RISKS AND COMPLICATIONS °Generally, this is a safe procedure. However, problems may occur, including: °· Bleeding. °· Infection. °· Elbow stiffness. °· Nerve damage. °· Blood vessel damage. °· Loosening or breaking of the screws or plates that are used for internal fixation. °· Failure of the fracture to heal properly. °· Need to have surgery again. °BEFORE THE PROCEDURE °· Ask your health care provider about: °¨ Changing or stopping your regular medicines. This is especially important if you are taking diabetes medicines or blood thinners. °¨ Taking medicines such as aspirin and ibuprofen. These medicines can thin your blood. Do not take these medicines before your procedure if your health care provider instructs you not to. °· Do not drink alcohol before your procedure or as directed by your health care provider. °· Do not use any tobacco  products, including cigarettes, chewing tobacco, or e-cigarettes before the procedure or as directed by your health care provider. If you need help quitting, ask your health care provider. Using tobacco products can delay or prevent bone healing. °· Follow instructions from your health care provider about eating or drinking restrictions. °· Plan to have someone take you home after the procedure. °· If you go home right after the procedure, plan to have someone with you for 24 hours. °PROCEDURE °· An IV tube will be inserted into one of your veins. °· You will be given one or more of the following: °¨ A medicine that helps you relax (sedative). °¨ A medicine that makes you fall asleep (general anesthetic). °¨ A medicine that is injected into an area of your body that numbs everything below the injection site (regional anesthetic). °· Your arm will be cleaned with a germ-killing solution (antiseptic) and covered with sterile cloths. °· Your surgeon will make an incision in your elbow over the fracture. °· The structures around your elbow will be moved aside carefully to expose your fracture. °· The bone pieces will be put back into their normal positions. °· Your surgeon may use metal screws, wires, pins, or plates to hold the bone pieces in position. °· After the bones are back in place, the incision will be closed with stitches (sutures) or staples. °· A bandage (dressing) will be placed over the incision. °· A splint may be placed on your arm. °The procedure may vary among health care providers   and hospitals. °AFTER THE PROCEDURE °· Your blood pressure, heart rate, breathing rate, and blood oxygen level will be monitored often until the medicines you were given have worn off.  °· It is normal to have pain after the procedure. You will be given medicine for pain as needed. °· You may be sent home with a sling to support your arm. °  °This information is not intended to replace advice given to you by your health care  provider. Make sure you discuss any questions you have with your health care provider. °  °Document Released: 09/28/2000 Document Revised: 08/19/2014 Document Reviewed: 03/31/2014 °Elsevier Interactive Patient Education ©2016 [employer] ° °

## 2015-05-04 NOTE — ED Notes (Signed)
Ortho tech has seen and splinted pt

## 2015-05-04 NOTE — Progress Notes (Signed)
By signing my name below, I, Stann Oresung-Kai Tsai, attest that this documentation has been prepared under the direction and in the presence of Elvina SidleKurt Lenah Messenger, MD. Electronically Signed: Stann Oresung-Kai Tsai, Scribe. 05/04/2015 , 4:45 PM .  Patient was seen in room 11 .   Patient ID: Taylor Valentine MRN: [id number], DOB: 11/28/1950, 65 y.o. Date of Encounter: 05/04/2015  Primary Physician: No primary care provider on file.  Chief Complaint:  Chief Complaint  Patient presents with  . Elbow Injury    fell  aprox. 5 feet in her work today is not W/C    HPI:  Taylor Valentine is a 65 y.o. female who presents to [facility] complaining of left elbow injury when she fell at work about 2 hours ago today. 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 She was standing on the scaffold over fresh floor and when she pushed against the wall, it slipped and caused her to fall. She dislocated her right elbow in the past, and states that her left elbow has similar feeling.   She also notes feeling some bruising in her hips, but denies loss of ROM.   Past Medical History  Diagnosis Date  . Atherosclerosis of native arteries of the extremities with gangrene (HCC)      Home Meds: Prior to Admission medications   Medication Sig Start Date End Date Taking? Authorizing Provider  lisinopril (PRINIVIL,ZESTRIL) 2.5 MG tablet Take 2.5 mg by mouth daily.   Yes Historical Provider, MD  metFORMIN (GLUCOPHAGE) 500 MG tablet Take 1 tablet (500 mg total) by mouth 2 (two) times daily with a meal. 01/17/12  Yes Ardyth Galachel Chamberlain, MD  Ibuprofen (ADVIL) 200 MG CAPS Take 200 mg by mouth. Reported on 05/04/2015    Historical Provider, MD    Allergies: No Known Allergies  Social History   Social History  . Marital Status: Married    Spouse Name: N/A  . Number of Children: N/A  . Years of Education: N/A   Occupational History  . Not on file.   Social History  Main Topics  . Smoking status: Former Smoker -- 0.50 packs/day    Types: Cigarettes    Quit date: 10/04/2010  . Smokeless tobacco: Not on file  . Alcohol Use: No  . Drug Use: No  . Sexual Activity: Yes    Birth Control/ Protection: Post-menopausal     Comment: Menstral periods stopped in 2004   Other Topics Concern  . Not on file   Social History Narrative     Review of Systems: Constitutional: negative for fever, chills, night sweats, weight changes, or fatigue  HEENT: negative for vision changes, hearing loss, congestion, rhinorrhea, ST, epistaxis, or sinus pressure Cardiovascular: negative for chest pain or palpitations Respiratory: negative for hemoptysis, wheezing, shortness of breath, or cough Abdominal: negative for abdominal pain, nausea, vomiting, diarrhea, or constipation Dermatological: negative for rash Neurologic: negative for headache, dizziness, or syncope Musc: positive for arthralgia (left elbow), myalgia (hip pain) All other systems reviewed and are otherwise negative with the exception to those above and in the HPI.  Physical Exam: Blood pressure 128/72, pulse 72, temperature 97.6 F (36.4 C), temperature source Oral, resp. rate 20, height 5\' 6"  (1.676 m), weight 178 lb 12.8 oz (81.103 kg), SpO2 95 %., Body mass index is 28.87 kg/(m^2). General: Well developed, well nourished, in no acute distress. Head: Normocephalic, atraumatic, eyes without discharge, sclera non-icteric, nares are without discharge.  Bilateral auditory canals clear, TM's are without perforation, pearly grey and translucent with reflective cone of light bilaterally. Oral cavity moist, posterior pharynx without exudate, erythema, peritonsillar abscess, or post nasal drip.  Neck: Supple. No thyromegaly. Full ROM. No lymphadenopathy. Lungs: Clear bilaterally to auscultation without wheezes, rales, or rhonchi. Breathing is unlabored. Heart: RRR with S1 S2. No murmurs, rubs, or gallops  appreciated. Msk:  Strength and tone normal for age. grossly swollen and ecchymotic over olecranon process process availability to flex about 45 degrees with pronate and supinate  Extremities/Skin: Warm and dry. No clubbing or cyanosis. No edema. No rashes or suspicious lesions. Neuro: Alert and oriented X 3. Moves all extremities spontaneously. Gait is normal. CNII-XII grossly in tact. Psych:  Responds to questions appropriately with a normal affect.   [facility] reading (PRIMARY) by Dr. Milus Glazier : left elbow xray: olecranon process of ulna, convoluted fracture with displacement of middle fragment   ASSESSMENT AND PLAN:  65 y.o. year old female with comminuted left elbow fracture of the olecranon. This will need orthopedic attention and I'm sending her over to [facility] for further evaluation. Robotic put her in a posterior splint protect the elbow on the way.    Signed, Elvina Sidle, MD 05/04/2015 4:45 PM

## 2015-05-04 NOTE — ED Provider Notes (Signed)
CSN: [id number]     Arrival date & time 05/04/15  1819 History   First MD Initiated Contact with Patient 05/04/15 1904     Chief Complaint  Patient presents with  . Fall  . Elbow Injury     (Consider location/radiation/quality/duration/timing/severity/associated sxs/prior Treatment) HPI   Gita Kudorena Bellisario is a 65 y.o. female with PMH significant for atherosclerosis of native arteries of the extremities with gangrene who presents with fall earlier today while hanging wallpaper now complaining of left elbow pain.  [facility] and found to have a comminuted left elbow fracture of the olecranon and sent here for orthopedic evaluation.  No meds PTA.  Patient arrives in ACE wrap and immobilization of left elbow.  No LOC or head injury.  Denies N/V, CP, SOB, abdominal pain, numbness, or weakness.   Past Medical History  Diagnosis Date  . Atherosclerosis of native arteries of the extremities with gangrene [facility])    Past Surgical History  Procedure Laterality Date  . 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 Heart disease Father    Social History  Substance Use Topics  . Smoking status: Former Smoker -- 0.50 packs/day    Types: Cigarettes    Quit date: 10/04/2010  . Smokeless tobacco: None  . Alcohol Use: No   OB History    No data available     Review of Systems All other systems negative unless otherwise stated in HPI    Allergies  Review of patient's allergies indicates no known allergies.  Home Medications   Prior to Admission medications   Medication Sig Start Date End Date Taking? Authorizing Provider  Ibuprofen (ADVIL) 200 MG CAPS Take 200 mg by mouth. Reported on 05/04/2015    Historical Provider, MD  lisinopril (PRINIVIL,ZESTRIL) 2.5 MG tablet Take 2.5 mg by mouth daily.    Historical Provider, MD   metFORMIN (GLUCOPHAGE) 500 MG tablet Take 1 tablet (500 mg total) by mouth 2 (two) times daily with a meal. 01/17/12   Ardyth Galachel Chamberlain, MD  oxyCODONE-acetaminophen (PERCOCET/ROXICET) 5-325 MG tablet Take 1 tablet by mouth every 4 (four) hours as needed for severe pain. 05/04/15   Ngan Qualls, PA-C   BP 127/78 mmHg  Pulse 101  Temp(Src) 98.2 F (36.8 C) (Oral)  SpO2 94% Physical Exam  Constitutional: She is oriented to person, place, and time. She appears well-developed and well-nourished.  HENT:  Head: Normocephalic and atraumatic.  Mouth/Throat: Oropharynx is clear and moist.  Eyes: Conjunctivae are normal. Pupils are equal, round, and reactive to light.  Neck: Normal range of motion. Neck supple.  Cardiovascular: Normal rate, regular rhythm and normal heart sounds.   No murmur heard. Capillary refill less than 3 seconds in 5 left digits.   Pulmonary/Chest: Effort normal and breath sounds normal. No accessory muscle usage or stridor. No respiratory distress. She has no wheezes. She has no rhonchi. She has no rales.  Abdominal: Soft. Bowel sounds are normal. She exhibits no distension. There is no tenderness.  Musculoskeletal: Normal range of motion.  Left elbow: posterior splint in place with sling.  Therefore, i am unable to perform complete evaluation of the elbow.  Patient able to move all 5 digits of left hand without difficulty.  Lymphadenopathy:    She has no cervical adenopathy.  Neurological: She is alert and oriented to person, place, and time.  Speech clear without dysarthria.  Sensation intact distal to injury.  Skin: Skin is warm and dry.  Psychiatric: She has a normal mood and affect. Her behavior is normal.    ED Course  Procedures (including critical care time) Labs Review Labs Reviewed - No data to display  Imaging Review Dg Elbow Complete Left  05/04/2015  CLINICAL DATA:  65 year old female with history of fall complaining of left elbow pain. EXAM: LEFT ELBOW  - COMPLETE 3+ VIEW COMPARISON:  No priors. FINDINGS: Multiple views of the left elbow demonstrate a comminuted fracture of the olecranon process the proximal ulna. This is displaced, with a separate fracture fragment displaced dorsally into the overlying soft tissues approximately 1.3 cm. There is also some angulation of the proximal fracture fragment. Overlying soft tissues are markedly swollen. Large joint effusion. IMPRESSION: 1. Comminuted and displaced fracture of the olecranon process of the proximal left ulna, as above. Electronically Signed   By: Trudie Reed M.D.   On: 05/04/2015 17:44   I have personally reviewed and evaluated these images and lab results as part of my medical decision-making.   EKG Interpretation None      MDM   Final diagnoses:  Olecranon fracture, left, closed, initial encounter    Patient presents from [facility] with left comminuted olecranon fracture.  NVI.  Posterior splint in place with immobilization; therefore, I am unable to perform further evaluation of the elbow.  I do not feel splint removal is warranted at this time given good capillary refill, intact sensation, and ability to move all 5 digits of the left hand.  Will manage pain and consult hand surgery. Per Dr. Janee Morn, we'll remove posterior splint and apply Ace wrap and keep immobilized with sling. Dr. Carollee Massed office will call her for follow-up appointment this week. Evaluation does not show pathology requiring ongoing emergent intervention or admission. Pt is hemodynamically stable and mentating appropriately. Discussed findings/results and plan with patient/guardian, who agrees with plan. All questions answered. Return precautions discussed and outpatient follow up given.      Cheri Fowler, PA-C 05/04/15 2123  Pricilla Loveless, MD 05/06/15 [phone]

## 2015-05-04 NOTE — Patient Instructions (Signed)
Go to [facility]

## 2015-05-04 NOTE — ED Notes (Signed)
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 Pt states she was sent here for surgery. Pt alert and oriented. Pt reports no LOC

## 2017-04-29 IMAGING — CR DG ELBOW COMPLETE 3+V*L*
4 series · 4 of 4 positions shown · non-contrast
Comparison: No priors.

CLINICAL DATA: 64-year-old female with history of fall complaining
of left elbow pain.

EXAM:
LEFT ELBOW - COMPLETE 3+ VIEW

[AP]
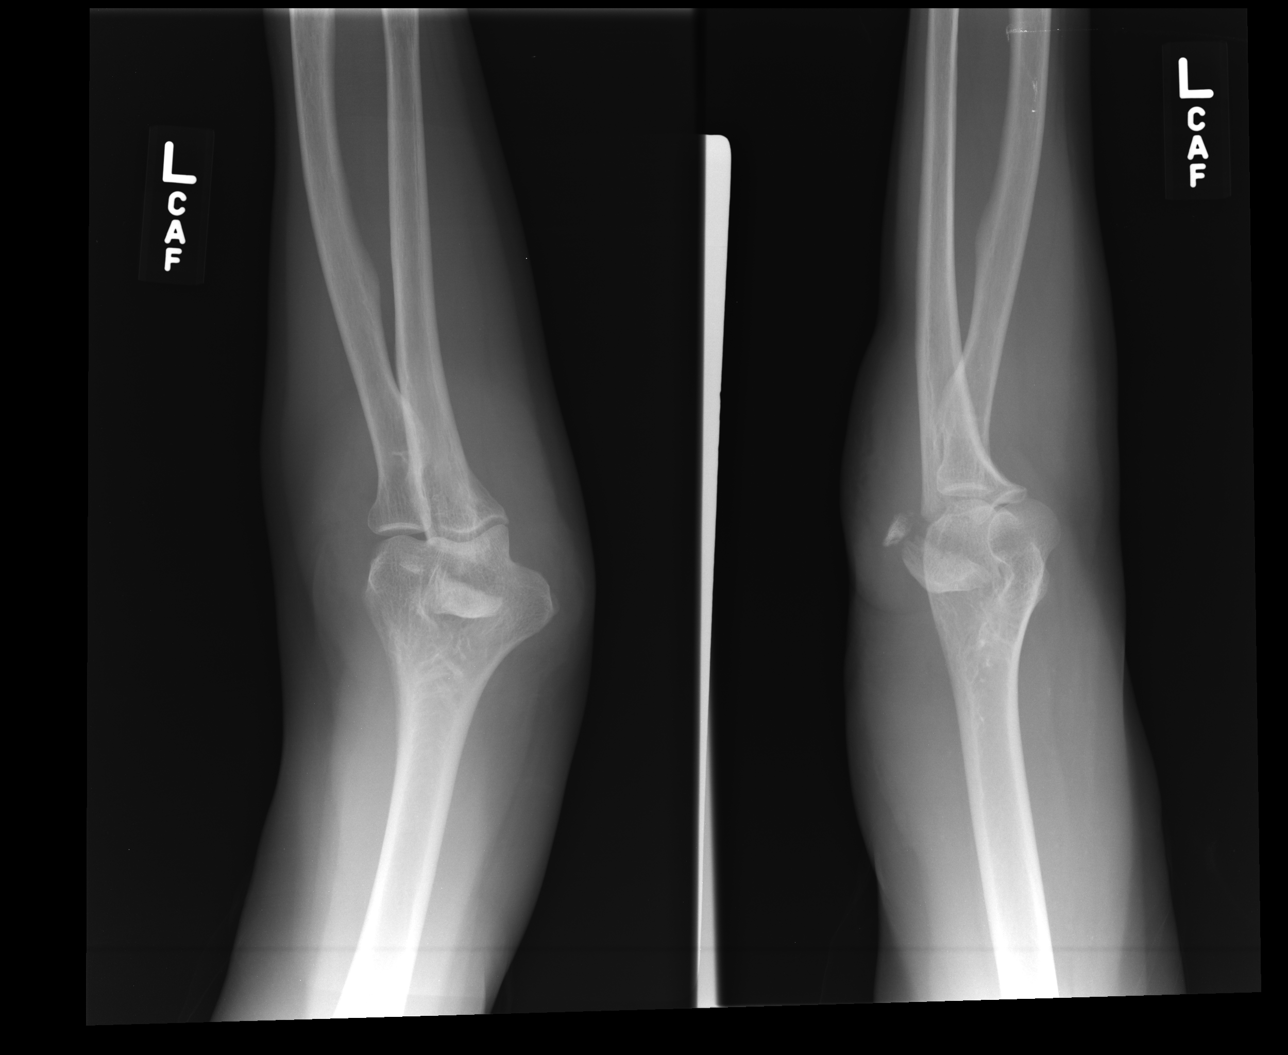

[lateral]
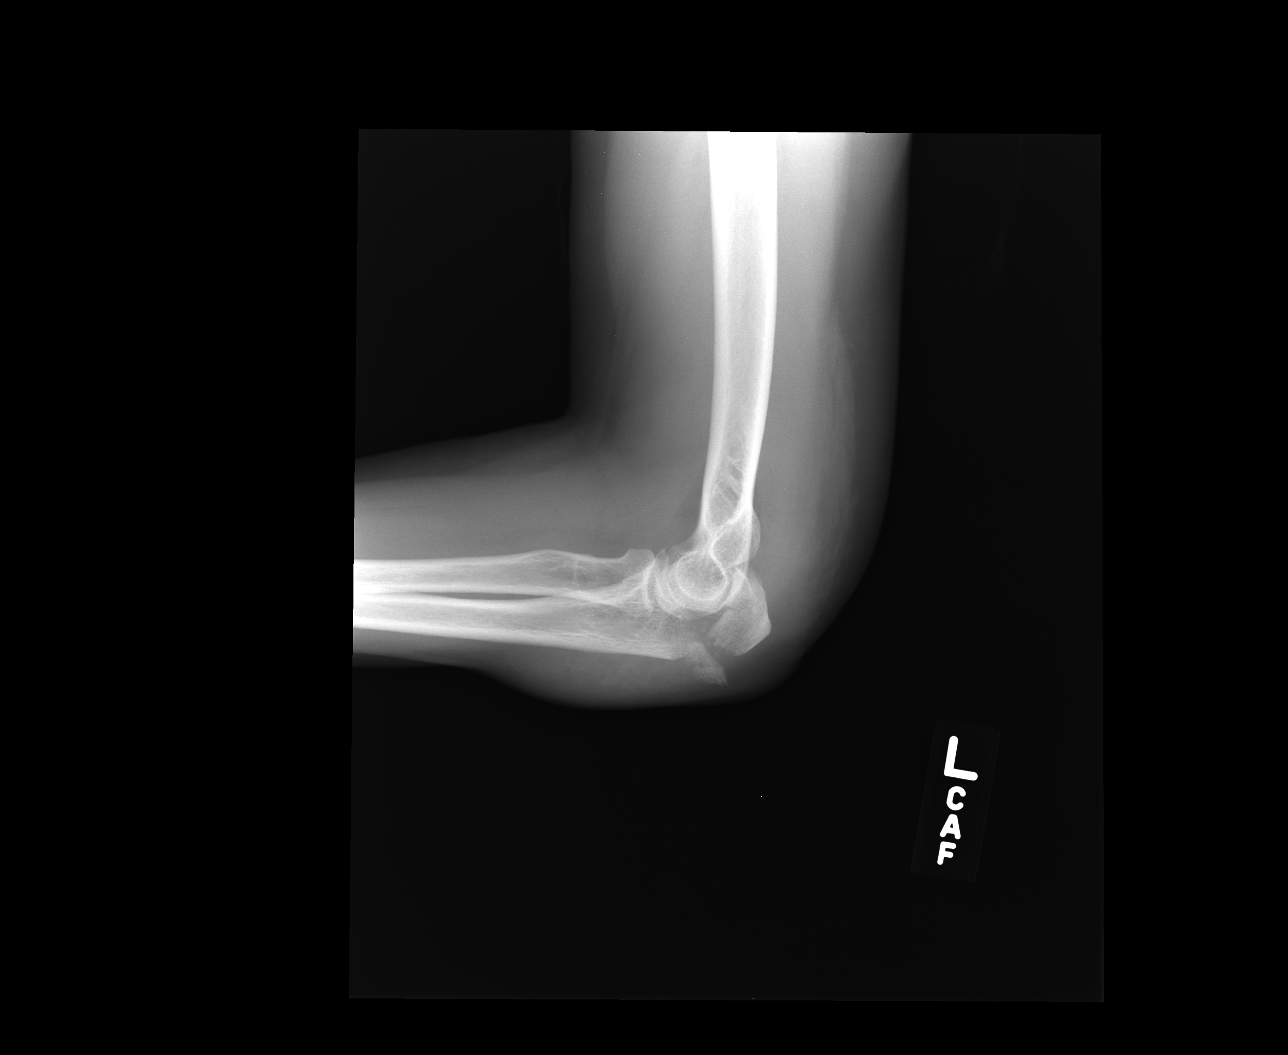

[ap obl ext rot]
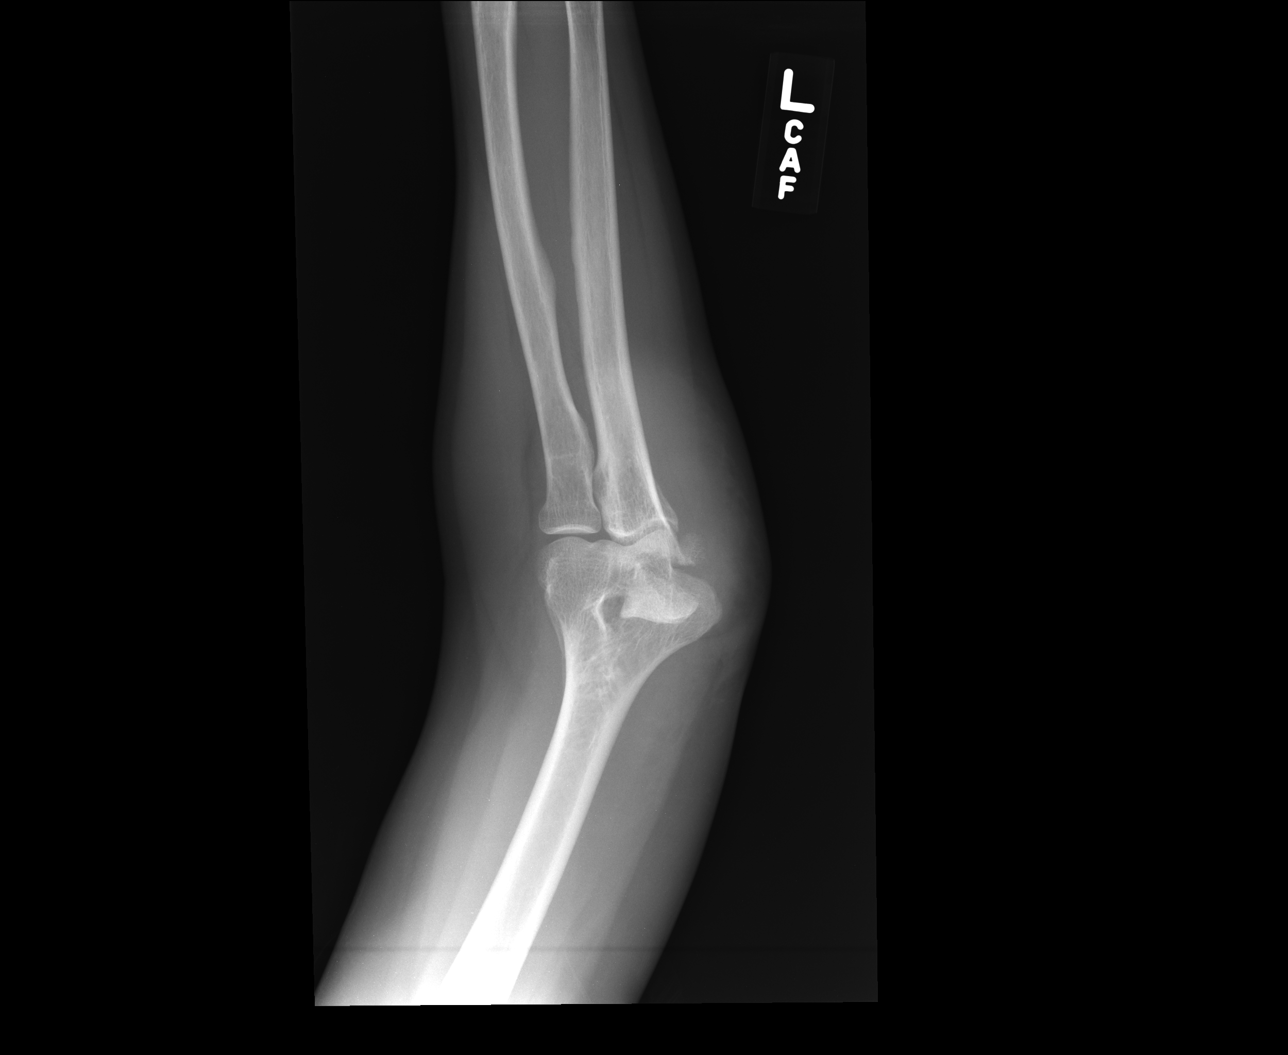

[radial head]
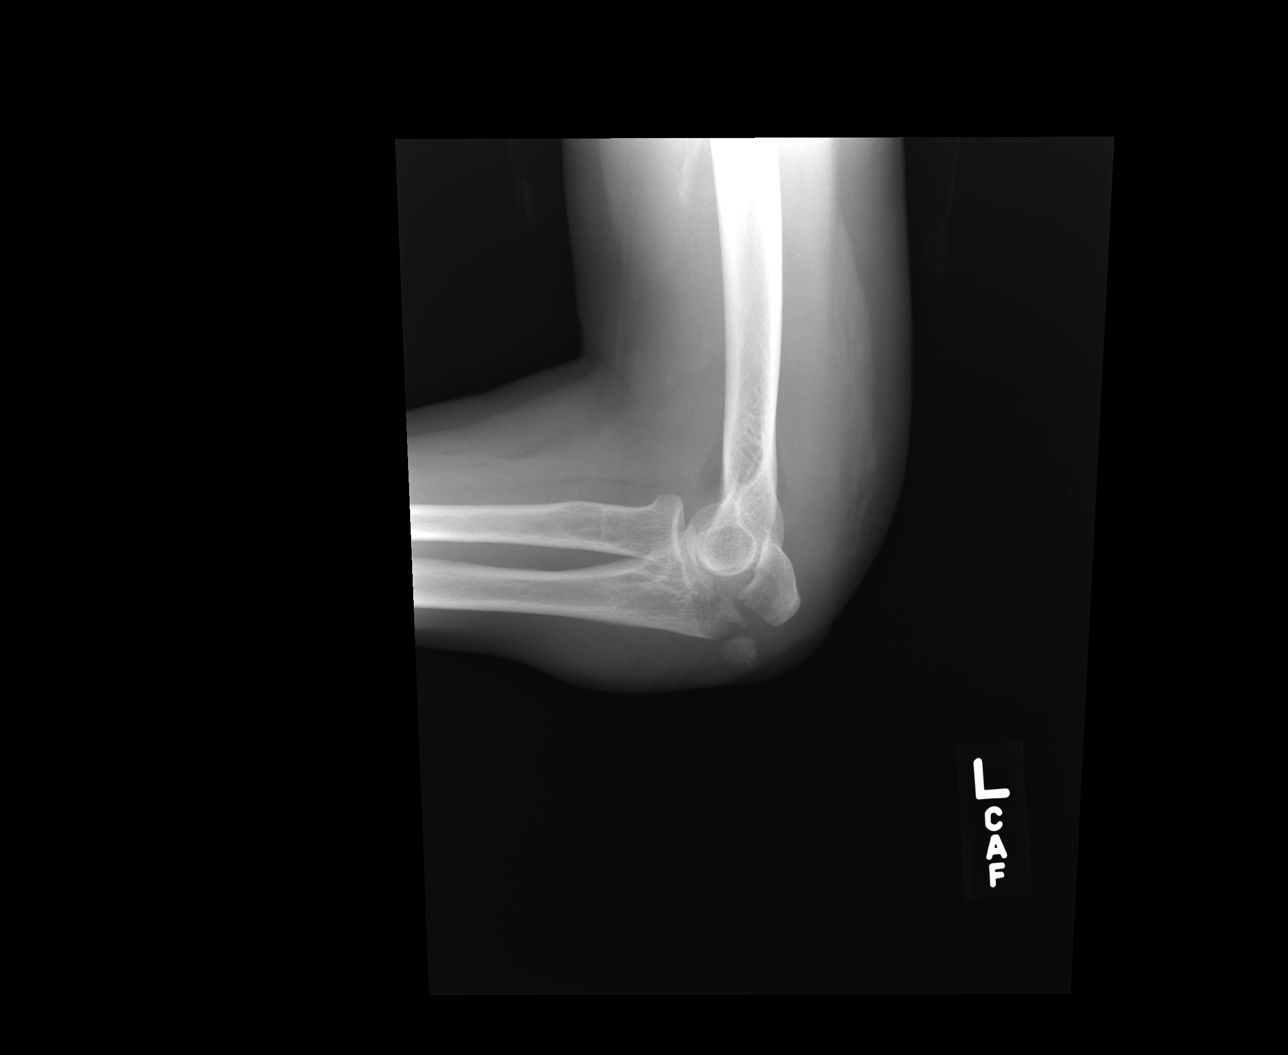

[4 of 4 positions shown; findings below may reference images not displayed]

FINDINGS: Multiple views of the left elbow demonstrate a comminuted fracture
of the olecranon process the proximal ulna. This is displaced, with
a separate fracture fragment displaced dorsally into the overlying
soft tissues approximately 1.3 cm. There is also some angulation of
the proximal fracture fragment. Overlying soft tissues are markedly
swollen. Large joint effusion.
IMPRESSION: 1. Comminuted and displaced fracture of the olecranon process of the
proximal left ulna, as above.
